# Patient Record
Sex: Male | Born: 1970 | Race: White | Hispanic: No | Marital: Single | Smoking: Never smoker
Health system: Southern US, Community
[De-identification: ages and names within clinical notes are randomized; demographics above are authoritative.]

---

## 1999-06-13 ENCOUNTER — Emergency Department (HOSPITAL_COMMUNITY): Admission: EM | Admit: 1999-06-13 | Discharge: 1999-06-13 | Payer: Self-pay | Admitting: Emergency Medicine

## 2000-03-03 ENCOUNTER — Encounter: Payer: Self-pay | Admitting: Emergency Medicine

## 2000-03-03 ENCOUNTER — Emergency Department (HOSPITAL_COMMUNITY): Admission: EM | Admit: 2000-03-03 | Discharge: 2000-03-03 | Payer: Self-pay | Admitting: Emergency Medicine

## 2000-03-05 ENCOUNTER — Emergency Department (HOSPITAL_COMMUNITY): Admission: EM | Admit: 2000-03-05 | Discharge: 2000-03-05 | Payer: Self-pay | Admitting: *Deleted

## 2000-03-05 ENCOUNTER — Encounter: Payer: Self-pay | Admitting: Emergency Medicine

## 2000-03-09 ENCOUNTER — Emergency Department (HOSPITAL_COMMUNITY): Admission: EM | Admit: 2000-03-09 | Discharge: 2000-03-09 | Payer: Self-pay | Admitting: Emergency Medicine

## 2001-10-05 ENCOUNTER — Emergency Department (HOSPITAL_COMMUNITY): Admission: EM | Admit: 2001-10-05 | Discharge: 2001-10-06 | Payer: Self-pay | Admitting: Emergency Medicine

## 2001-10-05 ENCOUNTER — Encounter: Payer: Self-pay | Admitting: Emergency Medicine

## 2001-10-06 ENCOUNTER — Encounter: Payer: Self-pay | Admitting: Emergency Medicine

## 2001-10-14 ENCOUNTER — Encounter: Payer: Self-pay | Admitting: Chiropractic Medicine

## 2001-10-14 ENCOUNTER — Ambulatory Visit (HOSPITAL_COMMUNITY): Admission: RE | Admit: 2001-10-14 | Discharge: 2001-10-14 | Payer: Self-pay | Admitting: Chiropractic Medicine

## 2004-12-17 ENCOUNTER — Emergency Department (HOSPITAL_COMMUNITY): Admission: EM | Admit: 2004-12-17 | Discharge: 2004-12-17 | Payer: Self-pay | Admitting: Emergency Medicine

## 2006-01-26 ENCOUNTER — Emergency Department (HOSPITAL_COMMUNITY): Admission: EM | Admit: 2006-01-26 | Discharge: 2006-01-26 | Payer: Self-pay | Admitting: Family Medicine

## 2006-02-04 ENCOUNTER — Ambulatory Visit: Payer: Self-pay | Admitting: Family Medicine

## 2017-03-01 ENCOUNTER — Other Ambulatory Visit: Payer: Self-pay | Admitting: Internal Medicine

## 2017-03-06 ENCOUNTER — Other Ambulatory Visit: Payer: Self-pay | Admitting: Internal Medicine

## 2017-03-07 ENCOUNTER — Other Ambulatory Visit: Payer: Self-pay | Admitting: Internal Medicine

## 2017-11-03 ENCOUNTER — Other Ambulatory Visit: Payer: Self-pay | Admitting: Nurse Practitioner

## 2017-11-03 DIAGNOSIS — I10 Essential (primary) hypertension: Secondary | ICD-10-CM

## 2017-11-03 MED ORDER — LOSARTAN POTASSIUM 25 MG PO TABS: 25.0000 mg | ORAL_TABLET | Freq: Every day | ORAL | 5 refills | Status: AC

## 2017-11-03 NOTE — Progress Notes (Signed)
Sent in losartan 25mg  daily to [facility] on [address] .

## 2020-04-20 ENCOUNTER — Emergency Department (HOSPITAL_COMMUNITY): Payer: Self-pay

## 2020-04-20 ENCOUNTER — Other Ambulatory Visit: Payer: Self-pay

## 2020-04-20 ENCOUNTER — Emergency Department (HOSPITAL_COMMUNITY)
Admission: EM | Admit: 2020-04-20 | Discharge: 2020-04-21 | Disposition: A | Discharge status: Home / Self Care | Payer: Self-pay | Attending: Emergency Medicine | Admitting: Emergency Medicine

## 2020-04-20 ENCOUNTER — Encounter (HOSPITAL_COMMUNITY): Payer: Self-pay

## 2020-04-20 DIAGNOSIS — S0083XA Contusion of other part of head, initial encounter: Secondary | ICD-10-CM | POA: Insufficient documentation

## 2020-04-20 DIAGNOSIS — Y92410 Unspecified street and highway as the place of occurrence of the external cause: Secondary | ICD-10-CM | POA: Insufficient documentation

## 2020-04-20 DIAGNOSIS — S92215A Nondisplaced fracture of cuboid bone of left foot, initial encounter for closed fracture: Secondary | ICD-10-CM | POA: Insufficient documentation
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
## 2020-04-21 MED ORDER — HYDROCODONE-ACETAMINOPHEN 5-325 MG PO TABS: 1.0000 | ORAL_TABLET | ORAL | 0 refills | Status: AC | PRN

## 2020-04-21 MED ORDER — OXYCODONE-ACETAMINOPHEN 5-325 MG PO TABS
2.0000 | ORAL_TABLET | Freq: Once | ORAL | Status: AC
  Administered 2020-04-21: 2 via ORAL
  Filled 2020-04-21: qty 2

## 2020-04-21 NOTE — Progress Notes (Signed)
Orthopedic Tech Progress Note Patient Details:  Caleb Romero 1970-05-15 [id number]  Ortho Devices Type of Ortho Device: Post (short leg) splint Ortho Device/Splint Location: LLE Ortho Device/Splint Interventions: Application   Post Interventions Patient Tolerated: Well Instructions Provided: Care of device   Catia Todorov E Yenesis Even 04/21/2020, 1:21 AM

## 2020-04-21 NOTE — Discharge Instructions (Addendum)
Take the prescribed medication as directed.  Do not drive while taking this medication as it can make you drowsy/sleepy. Try to keep your foot elevated at home to reduce swelling. Follow-up with Dr. August Saucer-- call his office Monday. Return to the ED for new or worsening symptoms.

## 2020-04-21 NOTE — ED Notes (Signed)
[facility] ortho tech paged

## 2022-07-27 IMAGING — CR DG FOOT COMPLETE 3+V*L*
3 series · 3 of 3 positions shown · non-contrast
Comparison: None.

CLINICAL DATA: Pain and swelling

EXAM:
LEFT FOOT - COMPLETE 3+ VIEW

[x foot ap left]
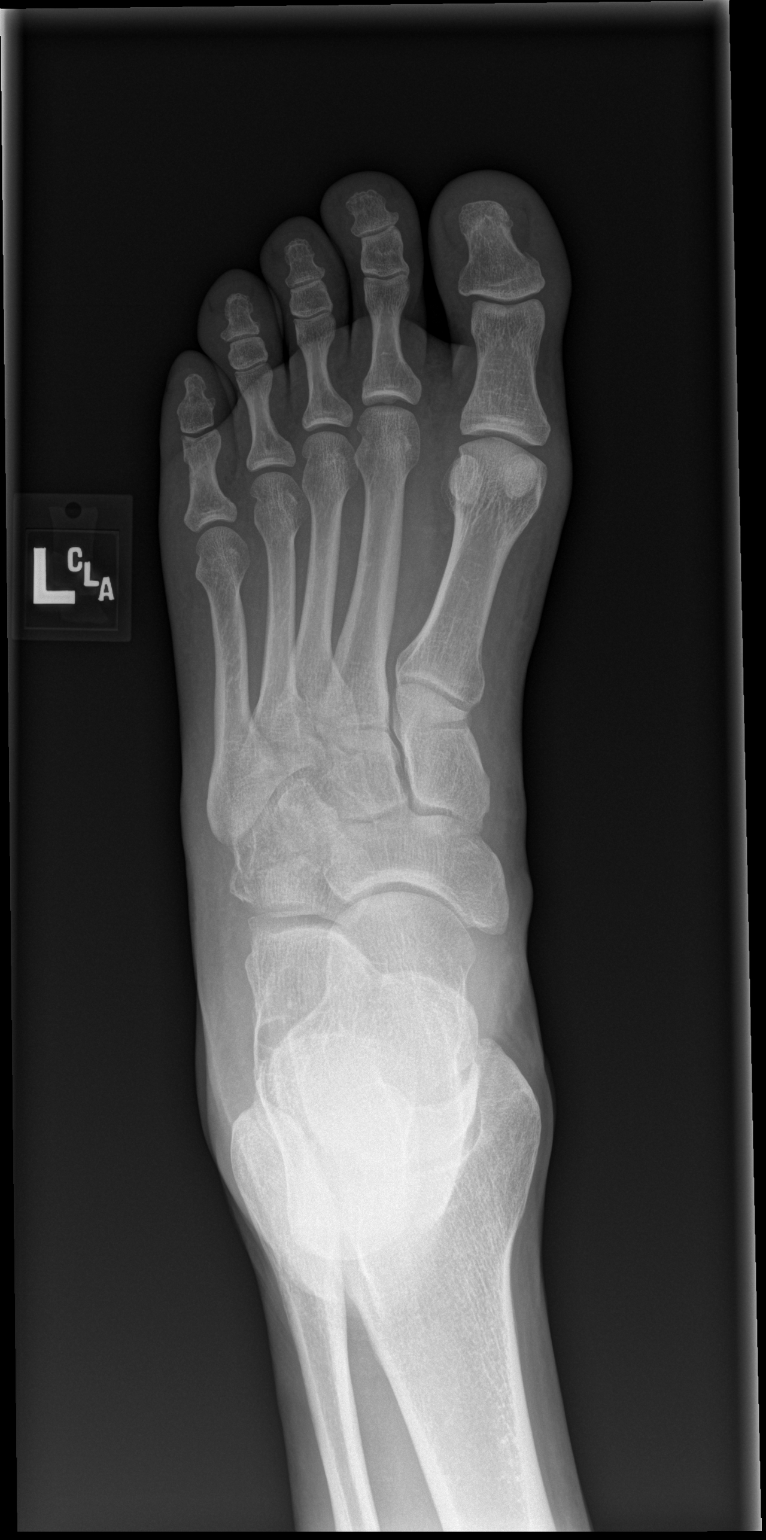

[x foot obl left]
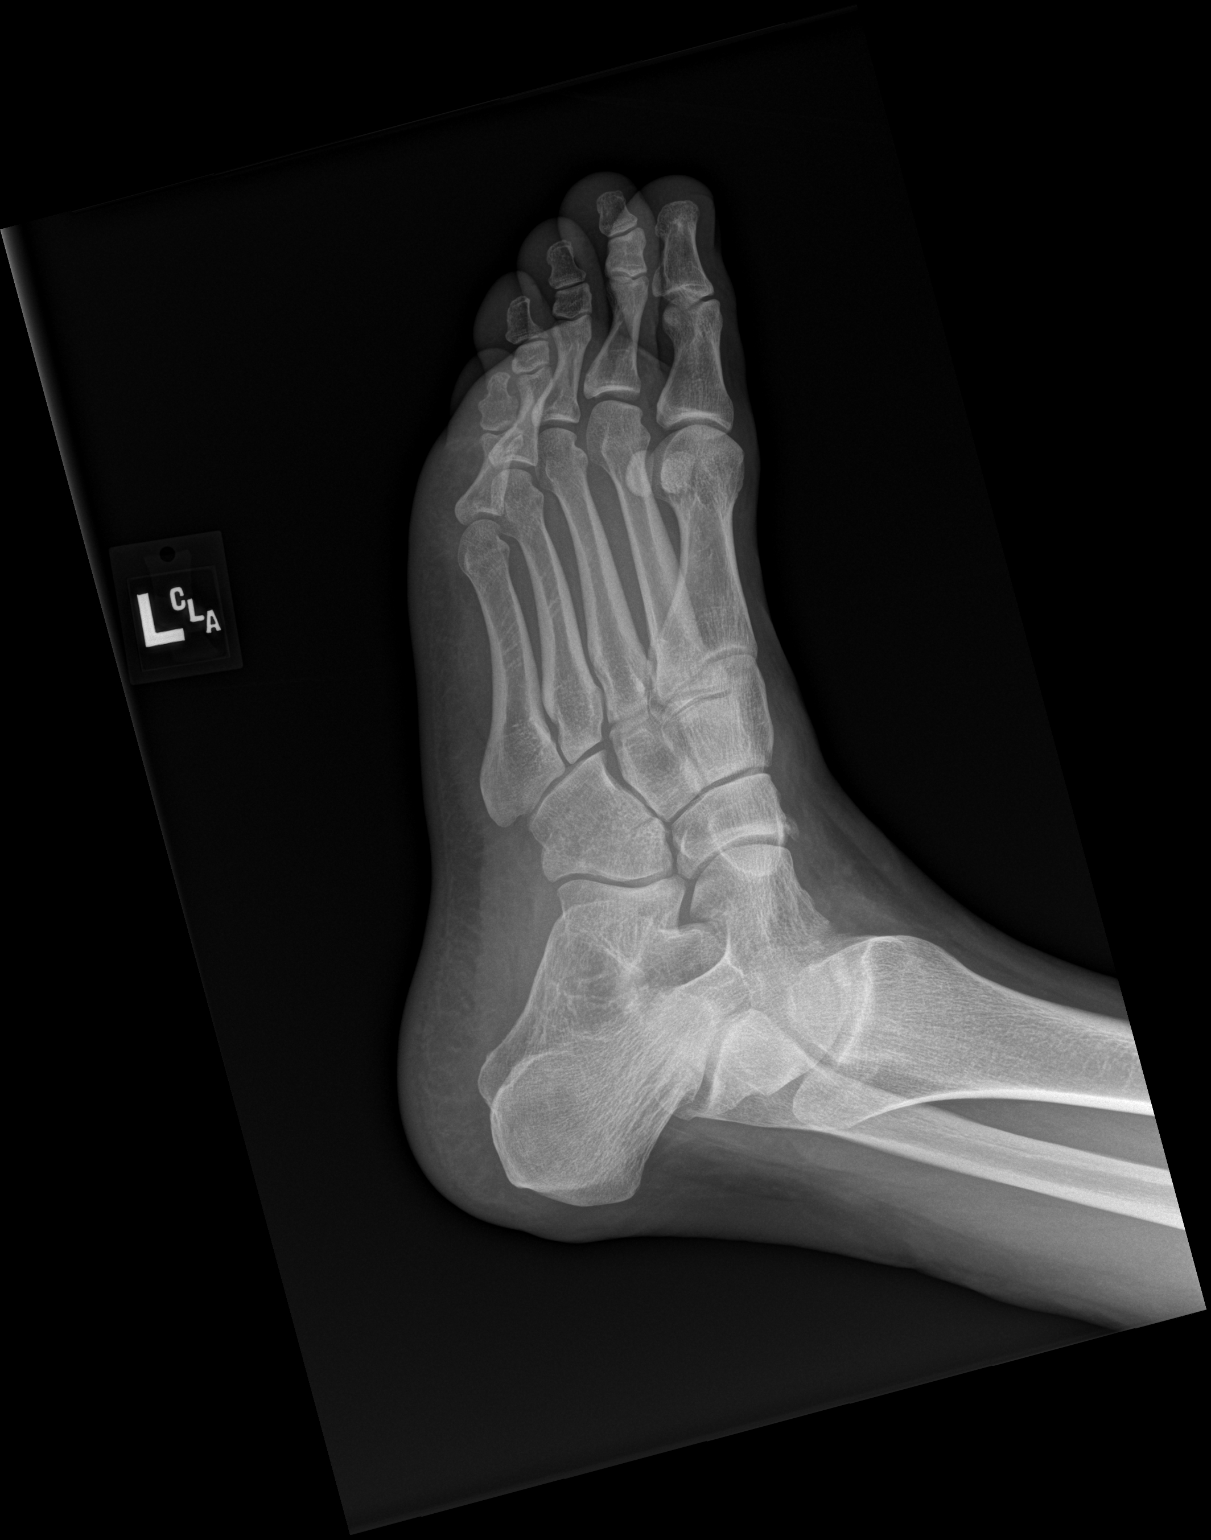

[x foot lat left]
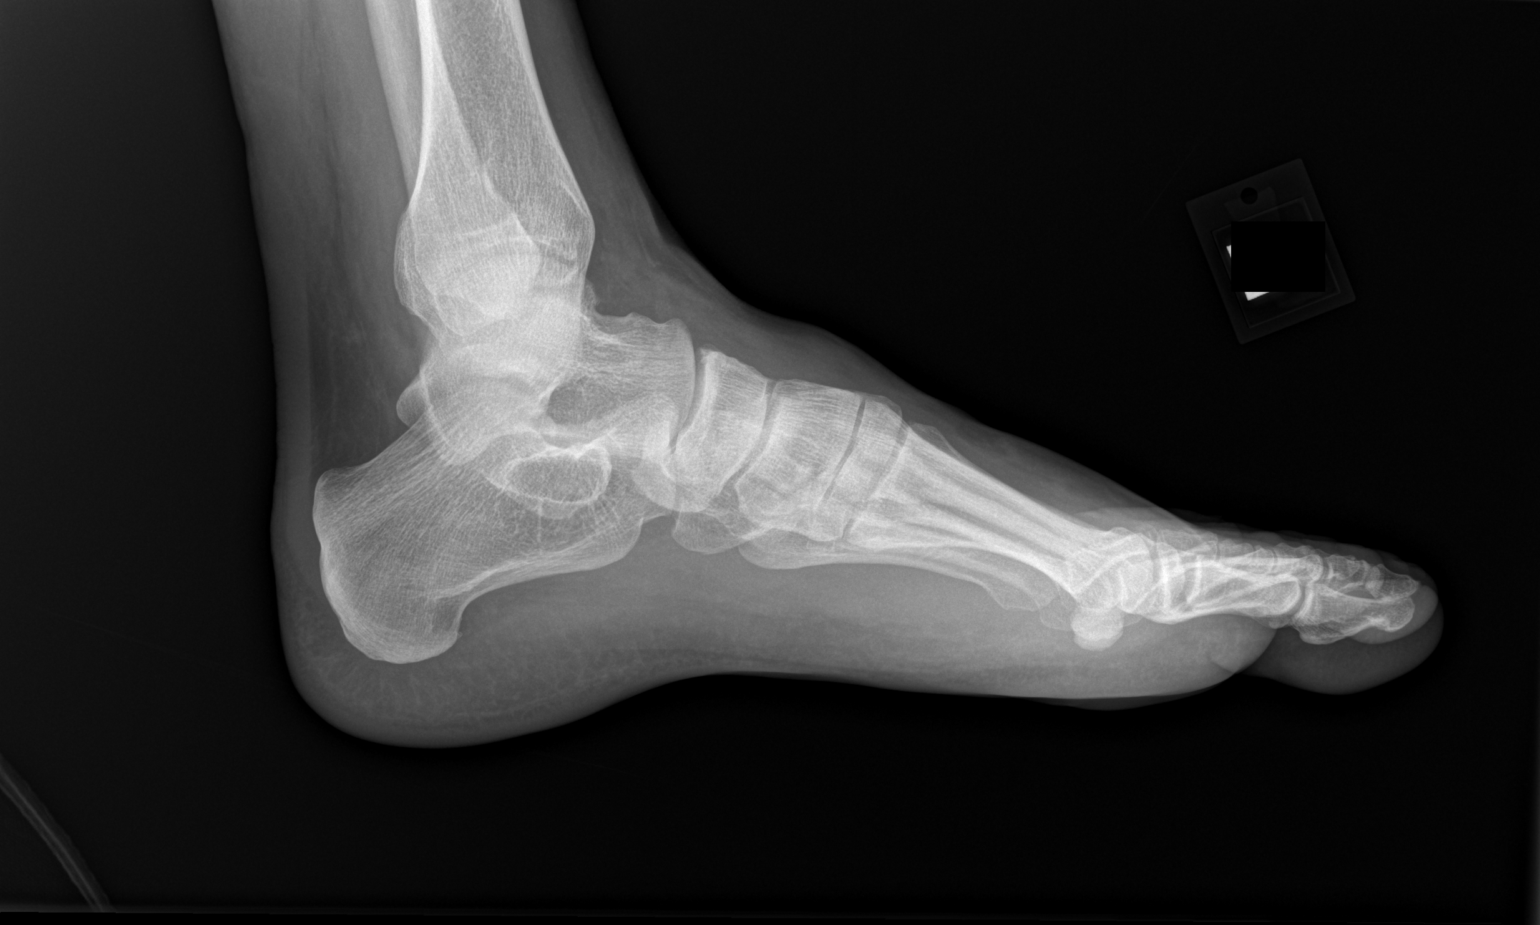

[3 of 3 positions shown; findings below may reference images not displayed]

FINDINGS: Possible acute nondisplaced fracture involving the cuboid bone. No
subluxation. No radiopaque foreign body
IMPRESSION: Possible acute nondisplaced fracture involving the cuboid bone.
